# Patient Record
Sex: Male | Born: 2000 | Hispanic: No | Marital: Single | Smoking: Never smoker
Health system: Southern US, Community
[De-identification: ages and names within clinical notes are randomized; demographics above are authoritative.]

## PROBLEM LIST (undated history)

## (undated) DIAGNOSIS — J45909 Unspecified asthma, uncomplicated: Secondary | ICD-10-CM

## (undated) HISTORY — PX: NO PAST SURGERIES: SHX2092

## (undated) HISTORY — PX: OTHER SURGICAL HISTORY: SHX169

---
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
## 2021-06-02 ENCOUNTER — Other Ambulatory Visit: Payer: Self-pay

## 2021-06-02 ENCOUNTER — Encounter (HOSPITAL_COMMUNITY): Payer: Self-pay

## 2021-06-02 ENCOUNTER — Ambulatory Visit (HOSPITAL_COMMUNITY)
Admission: EM | Admit: 2021-06-02 | Discharge: 2021-06-02 | Disposition: A | Discharge status: Home / Self Care | Payer: BC Managed Care – PPO | Attending: Emergency Medicine | Admitting: Emergency Medicine

## 2021-06-02 ENCOUNTER — Ambulatory Visit (INDEPENDENT_AMBULATORY_CARE_PROVIDER_SITE_OTHER): Payer: BC Managed Care – PPO

## 2021-06-02 DIAGNOSIS — S6000XA Contusion of unspecified finger without damage to nail, initial encounter: Secondary | ICD-10-CM

## 2021-06-02 DIAGNOSIS — S60051A Contusion of right little finger without damage to nail, initial encounter: Secondary | ICD-10-CM | POA: Diagnosis not present

## 2021-06-02 DIAGNOSIS — M79641 Pain in right hand: Secondary | ICD-10-CM | POA: Diagnosis not present

## 2021-06-02 NOTE — ED Provider Notes (Signed)
[facility]    CSN: [id number] Arrival date & time: 06/02/21  1310      History   Chief Complaint Chief Complaint  Patient presents with   Finger Injury    HPI Jeffery Saunders is a 21 y.o. male.   HPI Jeffery Saunders is a 21 y.o. male presenting to UC with c/o 4 days of mild hand pain with a faint bruise at the base of his Right little finger. Denies specific known injury but reports stumbling the other day and catching himself but also reports working out doing pushups recently and unsure if either contributed to recent symptoms. He is Right hand dominant. He reports mild numbness in little finger yesterday that has since resolved. He has not tried any home treatments.    History reviewed. No pertinent past medical history.  There are no problems to display for this patient.   Past Surgical History:  Procedure Laterality Date   asthma         Home Medications    Prior to Admission medications   Not on File    Family History History reviewed. No pertinent family history.  Social History Social History   Tobacco Use   Smoking status: Never   Smokeless tobacco: Never  Substance Use Topics   Alcohol use: Never   Drug use: Not Currently    Types: Marijuana     Allergies   Patient has no known allergies.   Review of Systems Review of Systems  Musculoskeletal:  Positive for arthralgias. Negative for myalgias.  Skin:  Positive for color change. Negative for wound.  Neurological:  Positive for numbness (yesterday, has resolved in Right little finger). Negative for weakness.    Physical Exam Triage Vital Signs ED Triage Vitals  Enc Vitals Group     BP 06/02/21 1508 135/75     Pulse Rate 06/02/21 1507 81     Resp 06/02/21 1507 17     Temp 06/02/21 1507 98.2 F (36.8 C)     Temp Source 06/02/21 1507 Oral     SpO2 06/02/21 1507 100 %     Weight --      Height --      Head Circumference --      Peak Flow --      Pain Score 06/02/21 1505 5      Pain Loc --      Pain Edu? --      Excl. in [place]? --    No data found.  Updated Vital Signs BP 135/75    Pulse 81    Temp 98.2 F (36.8 C) (Oral)    Resp 17    SpO2 100%   Visual Acuity Right Eye Distance:   Left Eye Distance:   Bilateral Distance:    Right Eye Near:   Left Eye Near:    Bilateral Near:     Physical Exam Vitals and nursing note reviewed.  Constitutional:      Appearance: Normal appearance. He is well-developed.  HENT:     Head: Normocephalic and atraumatic.  Cardiovascular:     Rate and Rhythm: Normal rate and regular rhythm.     Pulses:          Radial pulses are 2+ on the right side.  Pulmonary:     Effort: Pulmonary effort is normal.  Musculoskeletal:        General: Tenderness present. No swelling. Normal range of motion.     Cervical back: Normal range of  motion.     Comments: Right hand: full ROM, no deformity or edema. Mild tenderness over volar aspect of distal fifth metacarpal. FUll ROM all fingers without crepitus. 5/5 grip strength. No tenderness to Right little finger.   Skin:    General: Skin is warm and dry.     Capillary Refill: Capillary refill takes less than 2 seconds.     Findings: Bruising present.  Neurological:     General: No focal deficit present.     Mental Status: He is alert and oriented to person, place, and time.     Sensory: No sensory deficit.  Psychiatric:        Behavior: Behavior normal.     UC Treatments / Results  Labs (all labs ordered are listed, but only abnormal results are displayed) Labs Reviewed - No data to display  EKG   Radiology DG Hand Complete Right  Result Date: 06/02/2021 CLINICAL DATA:  Right hand pain. Bruise at the base of the fifth finger. EXAM: RIGHT HAND - COMPLETE 3+ VIEW COMPARISON:  None. FINDINGS: No fracture or bone lesion. Joints normally spaced and aligned.  No arthropathic changes. Normal soft tissues. IMPRESSION: Negative. Electronically Signed   By: Lajean Manes M.D.   On:  06/02/2021 15:50    Procedures Procedures (including critical care time)  Medications Ordered in UC Medications - No data to display  Initial Impression / Assessment and Plan / UC Course  I have reviewed the triage vital signs and the nursing notes.  Pertinent labs & imaging results that were available during my care of the patient were reviewed by me and considered in my medical decision making (see chart for details).     Reviewed imaging with pt. Reassured pt of normal imaging at this time. Encouraged conservative treatment. F/u with PCP or orthopedist as needed.  Final Clinical Impressions(s) / UC Diagnoses   Final diagnoses:  Contusion of finger of right hand, initial encounter     Discharge Instructions       You may take Tylenol and Ibuprofen as needed for pain and inflammation. You can also try a cool compress 2-3 times daily for 10-15 minutes at a time.  If lifting weights or using weight machines, consider wearing gloves to protect your hand as it heals. Follow up with primary care or sports medicine as needed.     ED Prescriptions   None    PDMP not reviewed this encounter.   Noe Gens, [place] 06/02/21 1745

## 2021-06-02 NOTE — Discharge Instructions (Signed)
°  You may take Tylenol and Ibuprofen as needed for pain and inflammation. You can also try a cool compress 2-3 times daily for 10-15 minutes at a time.  If lifting weights or using weight machines, consider wearing gloves to protect your hand as it heals. Follow up with primary care or sports medicine as needed.

## 2021-06-02 NOTE — ED Triage Notes (Signed)
Reports a bruise on his R hand. States he has had it x 4 days and states his pinky sometimes goes numb.   States he has pain in the bruised area.

## 2022-09-05 ENCOUNTER — Ambulatory Visit (HOSPITAL_COMMUNITY)
Admission: EM | Admit: 2022-09-05 | Discharge: 2022-09-05 | Disposition: A | Discharge status: Home / Self Care | Payer: BC Managed Care – PPO | Attending: Family Medicine | Admitting: Family Medicine

## 2022-09-05 ENCOUNTER — Encounter (HOSPITAL_COMMUNITY): Payer: Self-pay | Admitting: *Deleted

## 2022-09-05 DIAGNOSIS — L739 Follicular disorder, unspecified: Secondary | ICD-10-CM

## 2022-09-05 HISTORY — DX: Unspecified asthma, uncomplicated: J45.909

## 2022-09-05 MED ORDER — SULFAMETHOXAZOLE-TRIMETHOPRIM 800-160 MG PO TABS: 1.0000 | ORAL_TABLET | Freq: Two times a day (BID) | ORAL | 0 refills | Status: AC

## 2022-09-05 NOTE — ED Provider Notes (Signed)
[facility]    CSN: [id number] Arrival date & time: 09/05/22  1339      History   Chief Complaint Chief Complaint  Patient presents with   Rash    HPI Jeffery Saunders is a 22 y.o. male.   Patient is here for a rash/breakout around the beard area after he is at the barber.  This happens every time he trims the beard.  It gets itchy, and then painful.  Gets raised, pimply.  The left side is worse than the right.         Past Medical History:  Diagnosis Date   Asthma     There are no problems to display for this patient.   Past Surgical History:  Procedure Laterality Date   NO PAST SURGERIES         Home Medications    Prior to Admission medications   Not on File    Family History History reviewed. No pertinent family history.  Social History Social History   Tobacco Use   Smoking status: Never   Smokeless tobacco: Never  Vaping Use   Vaping Use: Never used  Substance Use Topics   Alcohol use: Yes    Comment: occasionally   Drug use: Yes    Types: Marijuana     Allergies   Patient has no known allergies.   Review of Systems Review of Systems  Constitutional: Negative.   HENT: Negative.    Respiratory: Negative.    Cardiovascular: Negative.   Gastrointestinal: Negative.   Skin:  Positive for rash.     Physical Exam Triage Vital Signs ED Triage Vitals  Enc Vitals Group     BP 09/05/22 1417 (!) 145/86     Pulse Rate 09/05/22 1417 (!) 59     Resp 09/05/22 1417 16     Temp 09/05/22 1417 97.7 F (36.5 C)     Temp Source 09/05/22 1417 Oral     SpO2 09/05/22 1417 98 %     Weight --      Height --      Head Circumference --      Peak Flow --      Pain Score 09/05/22 1420 5     Pain Loc --      Pain Edu? --      Excl. in [place]? --    No data found.  Updated Vital Signs BP (!) 145/86   Pulse (!) 59   Temp 97.7 F (36.5 C) (Oral)   Resp 16   SpO2 98%   Visual Acuity Right Eye Distance:   Left Eye Distance:    Bilateral Distance:    Right Eye Near:   Left Eye Near:    Bilateral Near:     Physical Exam Constitutional:      Appearance: Normal appearance.  Cardiovascular:     Rate and Rhythm: Normal rate.  Pulmonary:     Effort: Pulmonary effort is normal.  Skin:    Comments: Along the beard bilaterally the skin is irritated;  small pimple like lesions;  slight crusting noted  Neurological:     General: No focal deficit present.     Mental Status: He is alert.  Psychiatric:        Mood and Affect: Mood normal.      UC Treatments / Results  Labs (all labs ordered are listed, but only abnormal results are displayed) Labs Reviewed - No data to display  EKG   Radiology No  results found.  Procedures Procedures (including critical care time)  Medications Ordered in UC Medications - No data to display  Initial Impression / Assessment and Plan / UC Course  I have reviewed the triage vital signs and the nursing notes.  Pertinent labs & imaging results that were available during my care of the patient were reviewed by me and considered in my medical decision making (see chart for details).   Final Clinical Impressions(s) / UC Diagnoses   Final diagnoses:  Folliculitis     Discharge Instructions      You were seen today for possible folliculitis.  I have sent out an oral antibiotic to take twice/day x 7 days.  I do recommend you make an appointment with a dermatologist to discuss prevention of this going forward.     ED Prescriptions     Medication Sig Dispense Auth. Provider   sulfamethoxazole-trimethoprim (BACTRIM DS) 800-160 MG tablet Take 1 tablet by mouth 2 (two) times daily for 7 days. 14 tablet Jannifer Franklin, MD      PDMP not reviewed this encounter.   Jannifer Franklin, MD 09/05/22 1438

## 2022-09-05 NOTE — ED Triage Notes (Addendum)
Pt reports he gets a bumpy, pruritic, tender rash to beard area "every single time I get my hair trimmed with a clippers at the barber". Reports starting with same rash again starting May 3 after haircut May 2. Has been cleansing area regularly.

## 2022-09-05 NOTE — Discharge Instructions (Signed)
You were seen today for possible folliculitis.  I have sent out an oral antibiotic to take twice/day x 7 days.  I do recommend you make an appointment with a dermatologist to discuss prevention of this going forward.

## 2023-05-15 IMAGING — DX DG HAND COMPLETE 3+V*R*
3 series · 3 of 3 positions shown · non-contrast
Comparison: None.

CLINICAL DATA: Right hand pain. Bruise at the base of the fifth
finger.

EXAM:
RIGHT HAND - COMPLETE 3+ VIEW

[hand pa]
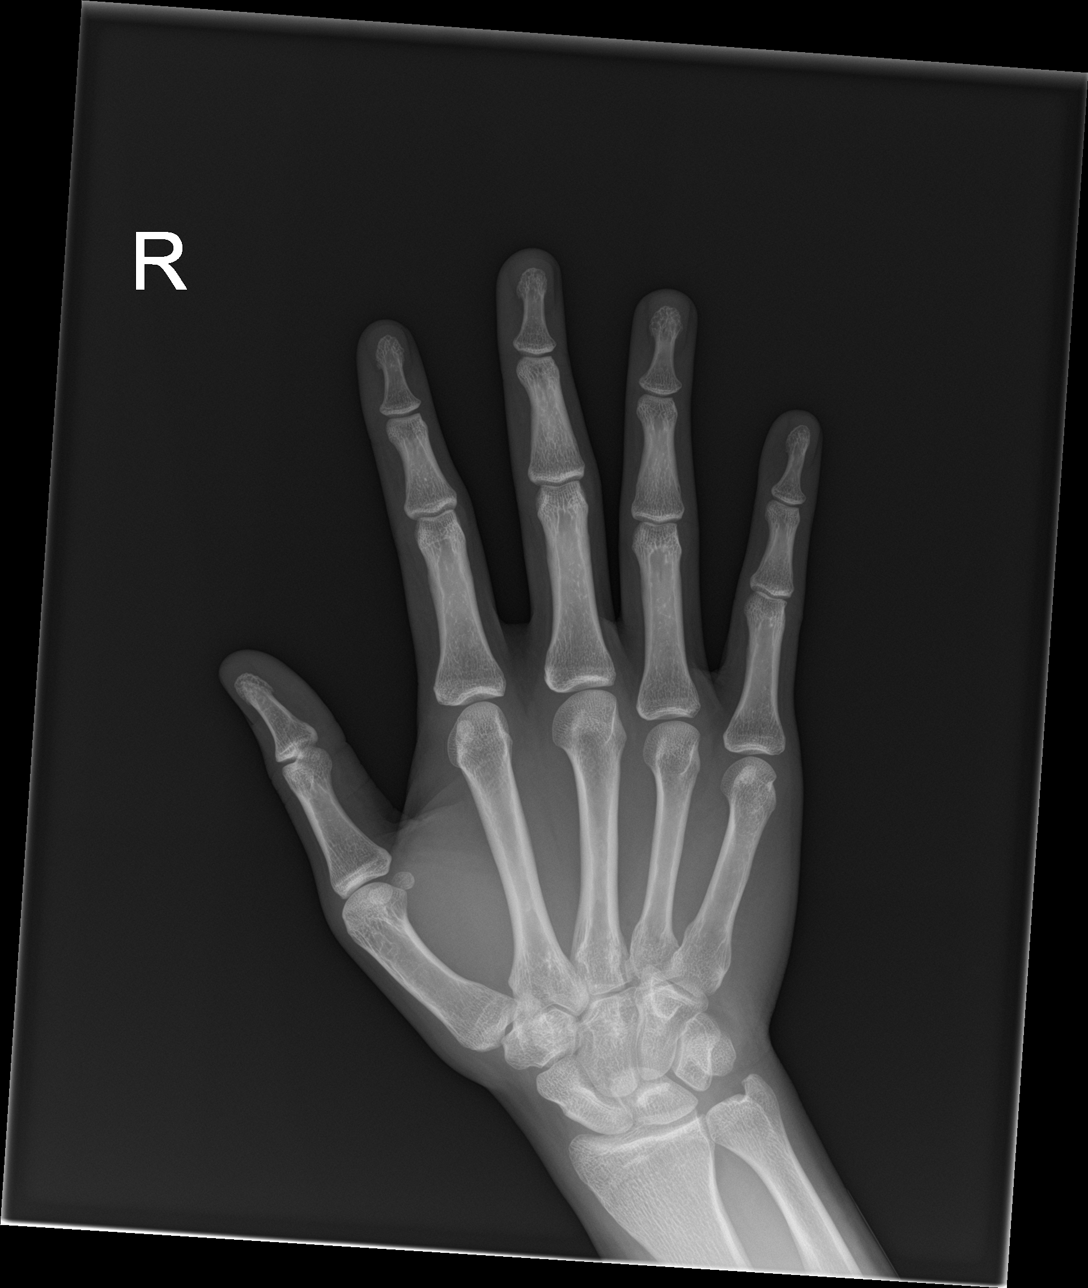

[hand obl]
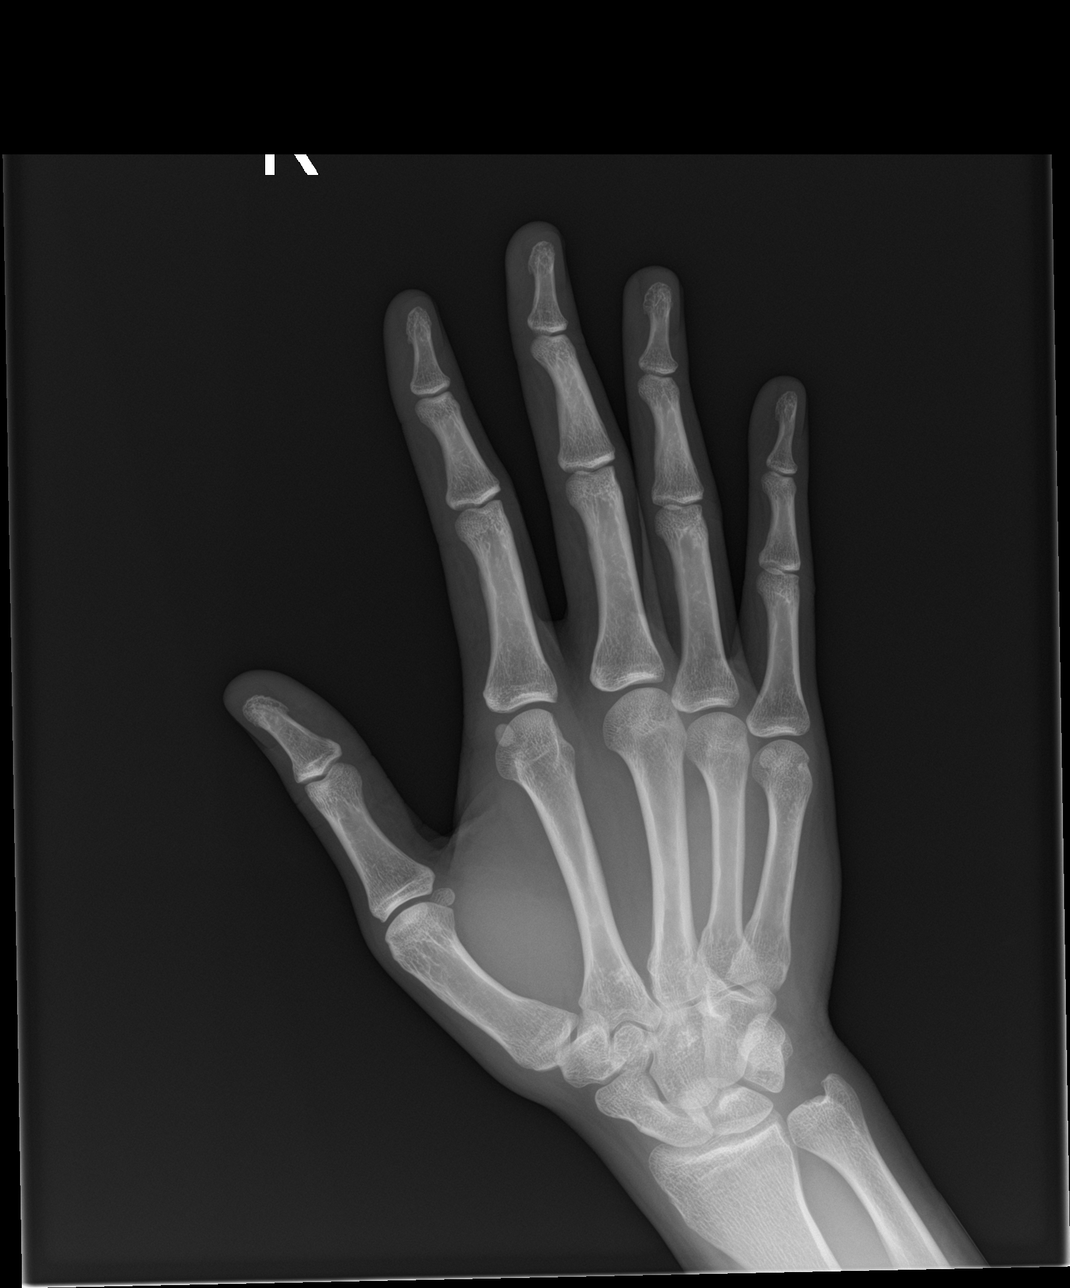

[hand lat]
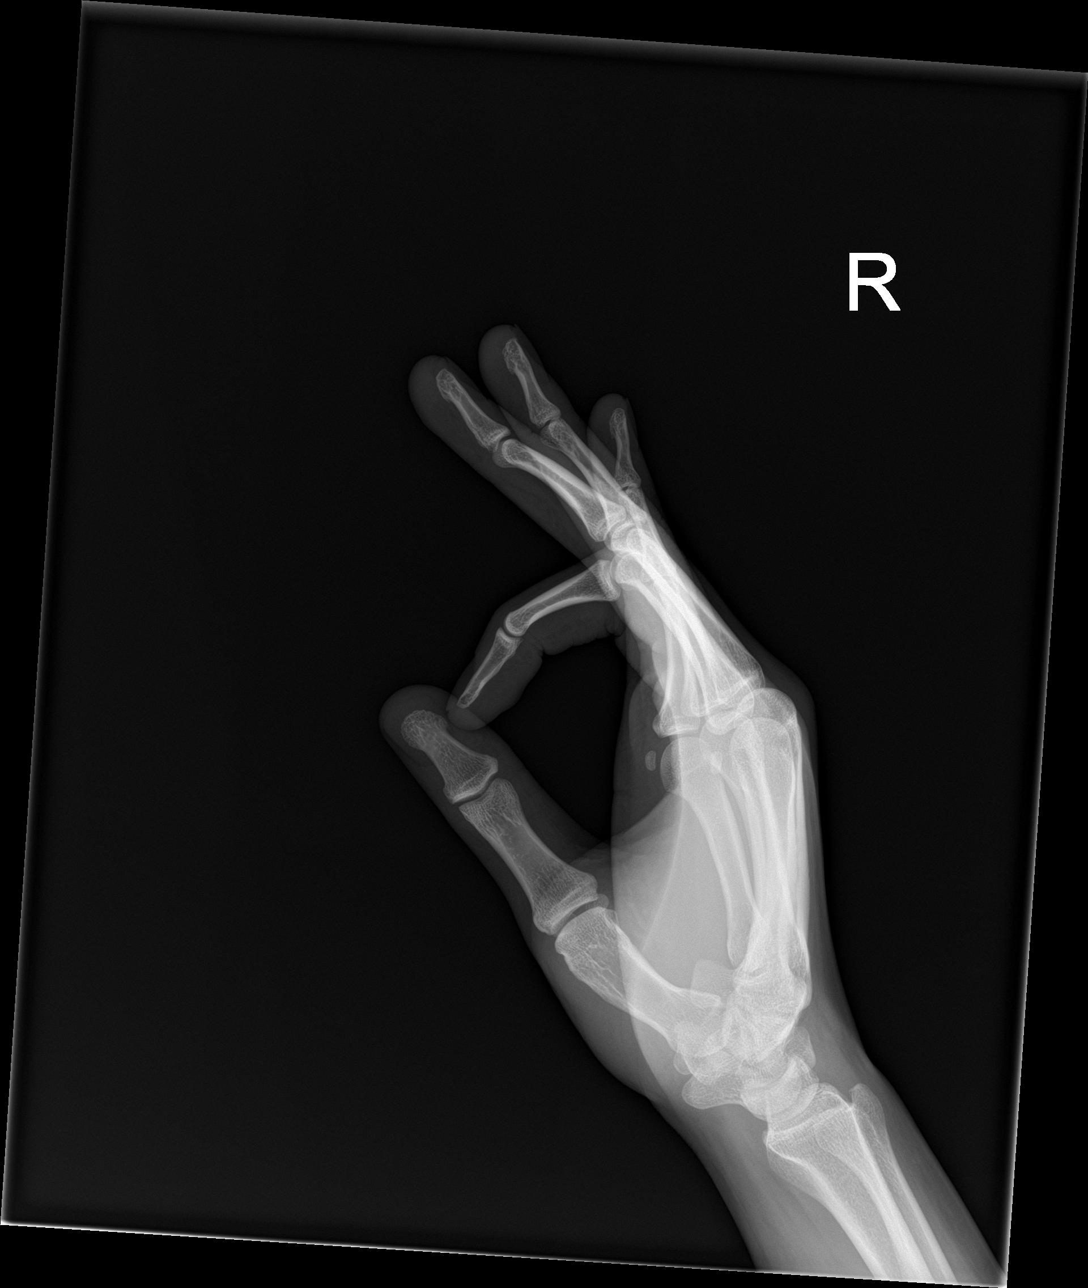

[3 of 3 positions shown; findings below may reference images not displayed]

FINDINGS: No fracture or bone lesion.

Joints normally spaced and aligned.  No arthropathic changes.

Normal soft tissues.
IMPRESSION: Negative.
